# Patient Record
Sex: Male | Born: 1960 | Race: White | Hispanic: No | Marital: Single | Smoking: Never smoker
Health system: Southern US, Community
[De-identification: ages and names within clinical notes are randomized; demographics above are authoritative.]

---

## 2002-08-02 ENCOUNTER — Encounter: Admission: RE | Admit: 2002-08-02 | Discharge: 2002-10-31 | Payer: Self-pay | Admitting: Family Medicine

## 2009-08-30 ENCOUNTER — Emergency Department (HOSPITAL_COMMUNITY): Admission: EM | Admit: 2009-08-30 | Discharge: 2009-08-30 | Payer: Self-pay | Admitting: Emergency Medicine

## 2012-02-09 ENCOUNTER — Telehealth: Payer: Self-pay | Admitting: Internal Medicine

## 2012-02-09 NOTE — Telephone Encounter (Signed)
LVOM for pt to return call.  °

## 2015-11-26 ENCOUNTER — Ambulatory Visit (HOSPITAL_COMMUNITY)
Admission: EM | Admit: 2015-11-26 | Discharge: 2015-11-26 | Disposition: A | Discharge status: Home / Self Care | Payer: BLUE CROSS/BLUE SHIELD | Attending: Family Medicine | Admitting: Family Medicine

## 2015-11-26 ENCOUNTER — Encounter (HOSPITAL_COMMUNITY): Payer: Self-pay | Admitting: Emergency Medicine

## 2015-11-26 DIAGNOSIS — J40 Bronchitis, not specified as acute or chronic: Secondary | ICD-10-CM

## 2015-11-26 MED ORDER — AZITHROMYCIN 250 MG PO TABS: 250.0000 mg | ORAL_TABLET | Freq: Every day | ORAL | 0 refills | Status: DC

## 2015-11-26 MED ORDER — HYDROCODONE-HOMATROPINE 5-1.5 MG/5ML PO SYRP: 5.0000 mL | ORAL_SOLUTION | Freq: Four times a day (QID) | ORAL | 0 refills | Status: DC | PRN

## 2015-11-26 NOTE — ED Triage Notes (Signed)
The patient presented to the [facility] with a complaint of a cough. The patient reported that he has had a productive cough x 10 days. The patient reported that the symptoms get worse at night and when laying down.

## 2015-11-26 NOTE — ED Provider Notes (Addendum)
[facility]    CSN: [id number] Arrival date & time: 11/26/15  1020     History   Chief Complaint Chief Complaint  Patient presents with  . Cough    HPI Marjory SneddonGregory Nyborg is a 55 y.o. male.   This a 55 year old man has had a cough for over 10 days. He says it's been productive. He does not smoke and has no history of asthma. He has not had a fever.  Patient is not sleeping well because when he lies down the cough gets much worse.  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      History reviewed. No pertinent past medical history.  There are no active problems to display for this patient.   History reviewed. No pertinent surgical history.     Home Medications    Prior to Admission medications   Medication Sig Start Date End Date Taking? Authorizing Provider  azithromycin (ZITHROMAX) 250 MG tablet Take 1 tablet (250 mg total) by mouth daily. Take first 2 tablets together, then 1 every day until finished. 11/26/15   Elvina SidleKurt Chan Rosasco, MD  HYDROcodone-homatropine [facility](HYCODAN) 5-1.5 MG/5ML syrup Take 5 mLs by mouth every 6 (six) hours as needed for cough. 11/26/15   Elvina SidleKurt Kenyatta Gloeckner, MD    Family History History reviewed. No pertinent family history.  Social History Social History  Substance Use Topics  . Smoking status: Never Smoker  . Smokeless tobacco: Never Used  . Alcohol use No     Allergies   Patient has no known allergies.   Review of Systems Review of Systems  Constitutional: Negative.   HENT: Negative.   Respiratory: Positive for cough.   Cardiovascular: Negative.   Gastrointestinal: Negative.   Musculoskeletal: Negative.   Neurological: Negative.      Physical Exam Triage Vital Signs ED Triage Vitals  Enc Vitals Group     BP 11/26/15 1137 125/73     Pulse Rate 11/26/15 1137 68     Resp 11/26/15 1137 16     Temp 11/26/15 1137 98.6 F (37 C)      Temp Source 11/26/15 1137 Oral     SpO2 11/26/15 1137 99 %     Weight --      Height --      Head Circumference --      Peak Flow --      Pain Score 11/26/15 1141 0     Pain Loc --      Pain Edu? --      Excl. in GC? --    No data found.   Updated Vital Signs BP 125/73 (BP Location: Left Arm)   Pulse 68   Temp 98.6 F (37 C) (Oral)   Resp 16   SpO2 99%    Physical Exam  Constitutional: He is oriented to person, place, and time. He appears well-developed and well-nourished.  HENT:  Head: Normocephalic.  Right Ear: External ear normal.  Left Ear: External ear normal.  Mouth/Throat: Oropharynx is clear and moist.  Eyes: Conjunctivae and EOM are normal.  Neck: Normal range of motion. Neck supple.  Cardiovascular: Normal rate, regular rhythm and normal heart sounds.   Pulmonary/Chest: Effort normal.  Musculoskeletal: Normal range of motion.  Neurological: He is alert and oriented to person, place, and time.  Skin: Skin is warm and dry.  Nursing note and vitals reviewed.    UC  Treatments / Results  Labs (all labs ordered are listed, but only abnormal results are displayed) Labs Reviewed - No data to display  EKG  EKG Interpretation None       Radiology No results found.  Procedures Procedures (including critical care time)  Medications Ordered in UC Medications - No data to display   Initial Impression / Assessment and Plan / UC Course  I have reviewed the triage vital signs and the nursing notes.  Pertinent labs & imaging results that were available during my care of the patient were reviewed by me and considered in my medical decision making (see chart for details).  Clinical Course     Final Clinical Impressions(s) / UC Diagnoses   Final diagnoses:  Bronchitis    New Prescriptions New Prescriptions   AZITHROMYCIN (ZITHROMAX) 250 MG TABLET    Take 1 tablet (250 mg total) by mouth daily. Take first 2 tablets together, then 1 every day  until finished.   HYDROCODONE-HOMATROPINE (HYCODAN) 5-1.5 MG/5ML SYRUP    Take 5 mLs by mouth every 6 (six) hours as needed for cough.     Elvina SidleKurt Elsie Baynes, MD 11/26/15 1200    Elvina SidleKurt Ioma Chismar, MD 11/26/15 1200

## 2020-02-22 ENCOUNTER — Other Ambulatory Visit: Payer: Self-pay

## 2020-02-22 ENCOUNTER — Emergency Department
Admission: EM | Admit: 2020-02-22 | Discharge: 2020-02-22 | Disposition: A | Discharge status: Home / Self Care | Payer: BC Managed Care – PPO | Attending: Emergency Medicine | Admitting: Emergency Medicine

## 2020-02-22 ENCOUNTER — Emergency Department: Payer: BC Managed Care – PPO

## 2020-02-22 DIAGNOSIS — R1084 Generalized abdominal pain: Secondary | ICD-10-CM | POA: Diagnosis not present

## 2020-02-22 DIAGNOSIS — R110 Nausea: Secondary | ICD-10-CM | POA: Insufficient documentation

## 2020-02-22 DIAGNOSIS — R109 Unspecified abdominal pain: Secondary | ICD-10-CM | POA: Diagnosis present

## 2020-02-22 LAB — URINALYSIS, COMPLETE (UACMP) WITH MICROSCOPIC
Bacteria, UA: NONE SEEN
Bilirubin Urine: NEGATIVE
Glucose, UA: NEGATIVE mg/dL
Hgb urine dipstick: NEGATIVE
Ketones, ur: 5 mg/dL — AB
Leukocytes,Ua: NEGATIVE
Nitrite: NEGATIVE
Protein, ur: NEGATIVE mg/dL
Specific Gravity, Urine: 1.032 — ABNORMAL HIGH (ref 1.005–1.030)
Squamous Epithelial / LPF: NONE SEEN (ref 0–5)
pH: 7 (ref 5.0–8.0)

## 2020-02-22 LAB — CBC
HCT: 38.9 % — ABNORMAL LOW (ref 39.0–52.0)
Hemoglobin: 13.8 g/dL (ref 13.0–17.0)
MCH: 32.5 pg (ref 26.0–34.0)
MCHC: 35.5 g/dL (ref 30.0–36.0)
MCV: 91.5 fL (ref 80.0–100.0)
Platelets: 163 10*3/uL (ref 150–400)
RBC: 4.25 MIL/uL (ref 4.22–5.81)
RDW: 12.3 % (ref 11.5–15.5)
WBC: 5.2 10*3/uL (ref 4.0–10.5)
nRBC: 0 % (ref 0.0–0.2)

## 2020-02-22 LAB — COMPREHENSIVE METABOLIC PANEL
ALT: 28 U/L (ref 0–44)
AST: 34 U/L (ref 15–41)
Albumin: 4.3 g/dL (ref 3.5–5.0)
Alkaline Phosphatase: 62 U/L (ref 38–126)
Anion gap: 10 (ref 5–15)
BUN: 42 mg/dL — ABNORMAL HIGH (ref 6–20)
CO2: 24 mmol/L (ref 22–32)
Calcium: 9.2 mg/dL (ref 8.9–10.3)
Chloride: 103 mmol/L (ref 98–111)
Creatinine, Ser: 1.06 mg/dL (ref 0.61–1.24)
GFR, Estimated: >60 mL/min (ref >60)
Glucose, Bld: 148 mg/dL — ABNORMAL HIGH (ref 70–99)
Potassium: 3.6 mmol/L (ref 3.5–5.1)
Sodium: 137 mmol/L (ref 135–145)
Total Bilirubin: 0.7 mg/dL (ref 0.3–1.2)
Total Protein: 6.4 g/dL — ABNORMAL LOW (ref 6.5–8.1)

## 2020-02-22 LAB — LIPASE, BLOOD: Lipase: 50 U/L (ref 11–51)

## 2020-02-22 MED ORDER — ONDANSETRON 4 MG PO TBDP: 4.0000 mg | ORAL_TABLET | Freq: Four times a day (QID) | ORAL | 0 refills | Status: DC | PRN

## 2020-02-22 MED ORDER — DICYCLOMINE HCL 10 MG/ML IM SOLN
20.0000 mg | Freq: Once | INTRAMUSCULAR | Status: AC
  Administered 2020-02-22: 20 mg via INTRAMUSCULAR
  Filled 2020-02-22 (×2): qty 2

## 2020-02-22 MED ORDER — DICYCLOMINE HCL 20 MG PO TABS: 20.0000 mg | ORAL_TABLET | Freq: Three times a day (TID) | ORAL | 0 refills | Status: DC | PRN

## 2020-02-22 MED ORDER — ONDANSETRON HCL 4 MG/2ML IJ SOLN
4.0000 mg | Freq: Once | INTRAMUSCULAR | Status: AC
  Administered 2020-02-22: 4 mg via INTRAVENOUS
  Filled 2020-02-22: qty 2

## 2020-02-22 MED ORDER — SODIUM CHLORIDE 0.9 % IV BOLUS (SEPSIS)
1000.0000 mL | Freq: Once | INTRAVENOUS | Status: AC
  Administered 2020-02-22: 1000 mL via INTRAVENOUS

## 2020-02-22 MED ORDER — IOHEXOL 12 MG/ML PO SOLN
500.0000 mL | ORAL | Status: AC
  Administered 2020-02-22: 500 mL via ORAL

## 2020-02-22 MED ORDER — KETOROLAC TROMETHAMINE 30 MG/ML IJ SOLN
30.0000 mg | Freq: Once | INTRAMUSCULAR | Status: AC
  Administered 2020-02-22: 30 mg via INTRAVENOUS
  Filled 2020-02-22: qty 1

## 2020-02-22 MED ORDER — IOHEXOL 300 MG/ML  SOLN
100.0000 mL | Freq: Once | INTRAMUSCULAR | Status: AC | PRN
  Administered 2020-02-22: 100 mL via INTRAVENOUS

## 2020-02-22 NOTE — ED Notes (Signed)
CT tech at bedside to take pt for CT scan.

## 2020-02-22 NOTE — ED Provider Notes (Signed)
[facility] Emergency Department Provider Note  ____________________________________________   Event Date/Time   First MD Initiated Contact with Patient 02/22/20 0121     (approximate)  I have reviewed the triage vital signs and the nursing notes.   HISTORY  Chief Complaint Abdominal Pain    HPI Robert Huynh is a 60 y.o. male no significant past medical history who presents to the emergency department with diffuse, sharp and severe abdominal pain that started last night about 2 hours after eating dinner.  He has had nausea without vomiting.  States he was able to have a normal bowel movement without blood or melena.  No diarrhea.  States he felt like he had a fever at home but is afebrile here.  States pain came on suddenly.  He has never had similar symptoms.  No aggravating or alleviating factors.  No history of previous abdominal surgery.  Patient reports he normally is bradycardic and slightly hypotensive.  He denies any chest pain or shortness of breath.  States he has been very active for most of his life and was a runner which he attributes to his heart rate and blood pressure normally low.        History reviewed. No pertinent past medical history.  There are no problems to display for this patient.   No past surgical history on file.  Prior to Admission medications   Medication Sig Start Date End Date Taking? Authorizing Provider  dicyclomine (BENTYL) 20 MG tablet Take 1 tablet (20 mg total) by mouth every 8 (eight) hours as needed for spasms (Abdominal cramping). 02/22/20  Yes Alexiya Franqui, Baxter HireKristen N, DO  ondansetron (ZOFRAN ODT) 4 MG disintegrating tablet Take 1 tablet (4 mg total) by mouth every 6 (six) hours as needed for nausea or vomiting. 02/22/20  Yes Alexsus Papadopoulos, Layla MawKristen N, DO    Allergies Patient has no known allergies.  No family history on file.  Social History Social History   Tobacco Use  . Smoking status: Never Smoker  . Smokeless  tobacco: Never Used  Substance Use Topics  . Alcohol use: No    Review of Systems Constitutional: No fever. Eyes: No visual changes. ENT: No sore throat. Cardiovascular: Denies chest pain. Respiratory: Denies shortness of breath. Gastrointestinal: + nausea.  No vomiting, diarrhea. Genitourinary: Negative for dysuria. Musculoskeletal: Negative for back pain. Skin: Negative for rash. Neurological: Negative for focal weakness or numbness.  ____________________________________________   PHYSICAL EXAM:  VITAL SIGNS: ED Triage Vitals  Enc Vitals Group     BP 02/22/20 0022 (!) 92/49     Pulse Rate 02/22/20 0022 (!) 50     Resp 02/22/20 0022 18     Temp 02/22/20 0022 97.9 F (36.6 C)     Temp Source 02/22/20 0022 Oral     SpO2 02/22/20 0022 100 %     Weight 02/22/20 0023 160 lb (72.6 kg)     Height 02/22/20 0023 6\' 1"  (1.854 m)     Head Circumference --      Peak Flow --      Pain Score 02/22/20 0023 9     Pain Loc --      Pain Edu? --      Excl. in GC? --    CONSTITUTIONAL: Alert and oriented and responds appropriately to questions. Well-appearing; well-nourished HEAD: Normocephalic EYES: Conjunctivae clear, pupils appear equal, EOM appear intact ENT: normal nose; moist mucous membranes NECK: Supple, normal ROM CARD: Regular and bradycardic; S1 and S2 appreciated;  no murmurs, no clicks, no rubs, no gallops RESP: Normal chest excursion without splinting or tachypnea; breath sounds clear and equal bilaterally; no wheezes, no rhonchi, no rales, no hypoxia or respiratory distress, speaking full sentences ABD/GI: Normal bowel sounds; non-distended; soft, diffusely tender throughout the abdomen without guarding or rebound BACK: The back appears normal EXT: Normal ROM in all joints; no deformity noted, no edema; no cyanosis SKIN: Normal color for age and race; warm; no rash on exposed skin NEURO: Moves all extremities equally PSYCH: The patient's mood and manner are  appropriate.  ____________________________________________   LABS (all labs ordered are listed, but only abnormal results are displayed)  Labs Reviewed  COMPREHENSIVE METABOLIC PANEL - Abnormal; Notable for the following components:      Result Value   Glucose, Bld 148 (*)    BUN 42 (*)    Total Protein 6.4 (*)    All other components within normal limits  CBC - Abnormal; Notable for the following components:   HCT 38.9 (*)    All other components within normal limits  URINALYSIS, COMPLETE (UACMP) WITH MICROSCOPIC - Abnormal; Notable for the following components:   Color, Urine YELLOW (*)    APPearance CLOUDY (*)    Specific Gravity, Urine 1.032 (*)    Ketones, ur 5 (*)    All other components within normal limits  LIPASE, BLOOD   ____________________________________________  EKG   EKG Interpretation  Date/Time:  Thursday February 22 2020 00:26:18 EST Ventricular Rate:  48 PR Interval:  172 QRS Duration: 108 QT Interval:  452 QTC Calculation: 403 R Axis:   91 Text Interpretation: Sinus bradycardia Rightward axis Incomplete right bundle branch block Borderline ECG No old tracing to compare Confirmed by Rochele Raring [phone]) on 02/22/2020 2:06:41 AM       ____________________________________________  RADIOLOGY Normajean Baxter Cordelle Dahmen, personally viewed and evaluated these images (plain radiographs) as part of my medical decision making, as well as reviewing the written report by the radiologist.  ED MD interpretation: No bowel obstruction.  No perforation.  No ischemia.  Official radiology report(s): CT ABDOMEN PELVIS W CONTRAST  Result Date: 02/22/2020 CLINICAL DATA:  Periumbilical and right lower quadrant abdominal pain after meal. EXAM: CT ABDOMEN AND PELVIS WITH CONTRAST TECHNIQUE: Multidetector CT imaging of the abdomen and pelvis was performed using the standard protocol following bolus administration of intravenous contrast. CONTRAST:  OMNIPAQUE IOHEXOL 300  MG/ML  SOLN COMPARISON:  None. FINDINGS: Lower chest: No acute abnormality. Hepatobiliary: Periportal edema. Subcentimeter hypodensities too small to characterize within left hepatic lobe. No gallstones, gallbladder wall thickening, or pericholecystic fluid. No biliary dilatation. Pancreas: No focal lesion. Normal pancreatic contour. No surrounding inflammatory changes. No main pancreatic ductal dilatation. Spleen: Normal in size without focal abnormality. Adrenals/Urinary Tract: No adrenal nodule bilaterally. Bilateral kidneys enhance symmetrically. No hydronephrosis. No hydroureter. The urinary bladder is unremarkable. Stomach/Bowel: Stomach is within normal limits. No small large bowel wall thickening. No small or large bowel dilatation. Stool noted throughout the colon. Fecalized sign noted within many loops of the small bowel. No pneumatosis. Vascular/Lymphatic: No portal venous gas. No abdominal aorta or iliac aneurysm. Mild atherosclerotic plaque of the aorta and its branches. No abdominal, pelvic, or inguinal lymphadenopathy. Reproductive: The prostate is prominent in size. Other: Fluid noted throughout the mesentery. Swirling of the superior vessel mesentery. No intraperitoneal free gas. No organized fluid collection. Musculoskeletal: No acute or significant osseous findings. IMPRESSION: 1. Fecalized material throughout many loops of small bowel with associated stool  throughout the colon. Periportal edema as well as mesenteric fluid. 2. Swirling of the superior vessel mesentery with associated above findings could suggest a possible internal hernia; however, no definite transition point or bowel dilatation to suggest bowel obstruction. No bowel wall thickening, pneumatosis, or portal venous gas to suggest ischemia. No findings of bowel perforation. 3. Given appearance of the small bowel, consider clinical correlation for SIBO. Electronically Signed   By: Tish Frederickson M.D.   On: 02/22/2020 03:56     ____________________________________________   PROCEDURES  Procedure(s) performed (including Critical Care):  Procedures   ____________________________________________   INITIAL IMPRESSION / ASSESSMENT AND PLAN / ED COURSE  As part of my medical decision making, I reviewed the following data within the electronic MEDICAL RECORD NUMBER Nursing notes reviewed and incorporated, Labs reviewed, Ct reviewed , Old chart reviewed, A consult was requested and obtained from this/these consultant(s) Gastroenterology and Notes from prior ED visits         Patient here with abdominal pain after eating.  Differential includes gastritis, gastroenteritis, colitis, diverticulitis, UTI, kidney stone, cholecystitis, pancreatitis, appendicitis.  Labs obtained in triage are unremarkable.  Will obtain CT of the abdomen pelvis for further evaluation.  Reports he drove himself to the emergency department.  Will give IV fluids, Toradol and Zofran for symptomatic relief.  ED PROGRESS  2:22 AM  Pt reports no improvement in pain after Toradol.  He does not have anyone to drive him home at this time if his work-up is unremarkable.  Will give IM Bentyl for pain control.  4:27 AM Pt reports pain gone after Bentyl.  CT scan shows fecalized material throughout many loops of small bowel with periportal edema as well as mesenteric fluid.  He has swirling of the superior vessel mesentery that could suggest an internal hernia but no definite transition point, bowel dilation to suggest bowel obstruction.  No signs of bowel ischemia or perforation.  Radiologist concern for possible small intestine bacterial overgrowth.  He denies history of abdominal pain, bloating, gas.  Will discuss with GI on-call for recommendations.  4:29 AM  Spoke with Dr. Norma Fredrickson on-call for GI.  He agrees that it would be very difficult to diagnose small intestine bacterial overgrowth by CT imaging and recommends conservative management with  outpatient GI follow-up.  Patient has had a normal colonoscopy in June 2021 other than having one polyp.  His gastroenterologist is Dr. Elnoria Howard in [place].  Patient feels comfortable with plan to follow-up with Dr. Elnoria Howard.  Recommended bland diet for the next several days.  Will discharge with Bentyl, Zofran.  Discussed return precautions.  He is comfortable with this plan.  Urine shows no sign of infection but does show yeast in his urine.  He is asymptomatic and not undergoing urologic procedure or immunocompromise.  Therapy not indicated at this time. ____________________________________________   FINAL CLINICAL IMPRESSION(S) / ED DIAGNOSES  Final diagnoses:  Generalized abdominal pain     ED Discharge Orders         Ordered    dicyclomine (BENTYL) 20 MG tablet  Every 8 hours PRN        02/22/20 0512    ondansetron (ZOFRAN ODT) 4 MG disintegrating tablet  Every 6 hours PRN        02/22/20 2094          *Please note:  Robert Huynh was evaluated in Emergency Department on 02/22/2020 for the symptoms described in the history of present illness. He was evaluated in the  context of the global COVID-19 pandemic, which necessitated consideration that the patient might be at risk for infection with the SARS-CoV-2 virus that causes COVID-19. Institutional protocols and algorithms that pertain to the evaluation of patients at risk for COVID-19 are in a state of rapid change based on information released by regulatory bodies including the CDC and federal and state organizations. These policies and algorithms were followed during the patient's care in the ED.  Some ED evaluations and interventions may be delayed as a result of limited staffing during and the pandemic.*   Note:  This document was prepared using Dragon voice recognition software and may include unintentional dictation errors.   Nicholas Ossa, Layla Maw, DO 02/22/20 [phone]

## 2020-02-22 NOTE — ED Notes (Signed)
Pt provided with urinal, reports will attempt to provide UA.

## 2020-02-22 NOTE — ED Notes (Signed)
Pt states coming in for abdominal pain that started several hours after eating dinner. Pt states some nausea, but denies vomiting and diarrhea. Pt states prior to dinner last night he had a "large bowel movement."  Pt on cardiac, bp and pulse ox monitor on pt

## 2020-02-22 NOTE — Discharge Instructions (Signed)
Your labs, urine today were reassuring.  Your CT scan showed signs that the radiologist was concerned could be from small intestinal bacterial overgrowth.  We discussed this with the gastroenterologist on-call who recommends conservative management and close follow-up with your GI physician as an outpatient.

## 2020-02-22 NOTE — ED Triage Notes (Addendum)
Pt to ed c/o abd pain that started 2-3 hours after eating chicken stir fry, and sesame and pumpkin seeds. Pt states pain is 9/10 stabbing pain. Pt has no GI hx. Pt unable to sit still during triage, pale in color.

## 2020-03-27 ENCOUNTER — Telehealth: Payer: BC Managed Care – PPO | Admitting: Family

## 2020-03-27 ENCOUNTER — Encounter: Payer: Self-pay | Admitting: Family

## 2020-03-27 DIAGNOSIS — Z09 Encounter for follow-up examination after completed treatment for conditions other than malignant neoplasm: Secondary | ICD-10-CM

## 2020-03-27 DIAGNOSIS — R109 Unspecified abdominal pain: Secondary | ICD-10-CM

## 2020-03-27 MED ORDER — DICYCLOMINE HCL 20 MG PO TABS: 20.0000 mg | ORAL_TABLET | Freq: Three times a day (TID) | ORAL | 0 refills | Status: AC | PRN

## 2020-03-27 MED ORDER — DICYCLOMINE HCL 20 MG PO TABS: 20.0000 mg | ORAL_TABLET | Freq: Three times a day (TID) | ORAL | 0 refills | Status: DC | PRN

## 2020-03-27 NOTE — Progress Notes (Signed)
Virtual Visit  Note Due to COVID-19 pandemic this visit was conducted virtually. This visit type was conducted due to national recommendations for restrictions regarding the COVID-19 Pandemic (e.g. social distancing, sheltering in place) in an effort to limit this patient's exposure and mitigate transmission in our community. All issues noted in this document were discussed and addressed.  A physical exam was not performed with this format.  I connected with Robert Huynh on 03/27/20 at 11:11 AM by video and verified that I am speaking with the correct person using two identifiers. [name] is currently located at home and no one  is currently with him during visit. The provider, Jannifer Rodney, FNP is located in their office at time of visit.  I discussed the limitations, risks, security and privacy concerns of performing an evaluation and management service by video and the availability of in person appointments. I also discussed with the patient that there may be a patient responsible charge related to this service. The patient expressed understanding and agreed to proceed.   History and Present Illness:   Pt presents to the office today with recurrent abdominal pain. He was seen in the ED on 02/22/20 and had a CT that showed "1. Fecalized material throughout many loops of small bowel with associated stool throughout the colon. Periportal edema as well as mesenteric fluid. 2. Swirling of the superior vessel mesentery with associated above findings could suggest a possible internal hernia; however, no definite transition point or bowel dilatation to suggest bowel obstruction. No bowel wall thickening, pneumatosis, or portal venous gas to suggest ischemia. No findings of bowel perforation. 3. Given appearance of the small bowel, consider clinical correlation for SIBO." He was discharged with a rx of zofran and bentyl. He reports he did not get either of these filled. He followed up with  his GI. He had a colonoscopy in June.  Abdominal Pain This is a recurrent problem. The current episode started more than 1 month ago. The problem occurs constantly. The problem has been gradually worsening. The pain is located in the generalized abdominal region, RLQ and LLQ. The pain is at a severity of 2/10 (constantly 2, but can be ). The quality of the pain is dull. The abdominal pain radiates to the back. Associated symptoms include belching and flatus. Pertinent negatives include no constipation, diarrhea, dysuria, fever, nausea or vomiting.     Review of Systems  Constitutional: Negative for fever.  Gastrointestinal: Positive for abdominal pain and flatus. Negative for constipation, diarrhea, nausea and vomiting.  Genitourinary: Negative for dysuria.     Observations/Objective: No SOB or distress noted   Assessment and Plan: 1. Abdominal pain, unspecified abdominal location - dicyclomine (BENTYL) 20 MG tablet; Take 1 tablet (20 mg total) by mouth every 8 (eight) hours as needed for spasms (Abdominal cramping).  Dispense: 40 tablet; Refill: 0  2. Hospital discharge follow-up - dicyclomine (BENTYL) 20 MG tablet; Take 1 tablet (20 mg total) by mouth every 8 (eight) hours as needed for spasms (Abdominal cramping).  Dispense: 40 tablet; Refill: 0   Encourage bland diet Force fluids Try Bentyl as needed for pain Follow up with GI    I discussed the assessment and treatment plan with the patient. The patient was provided an opportunity to ask questions and all were answered. The patient agreed with the plan and demonstrated an understanding of the instructions.   The patient was advised to call back or seek an in-person evaluation if the symptoms worsen or  if the condition fails to improve as anticipated.  The above assessment and management plan was discussed with the patient. The patient verbalized understanding of and has agreed to the management plan. Patient is aware to call  the clinic if symptoms persist or worsen. Patient is aware when to return to the clinic for a follow-up visit. Patient educated on when it is appropriate to go to the emergency department.   Time call ended: 11:41 AM   I provided 30 minutes of  face-to-face time during this encounter.    Jannifer Rodney, FNP

## 2022-04-06 IMAGING — CT CT ABD-PELV W/ CM
2 of 5 series · 15 of 46 positions shown, 17 images · IV contrast (APPLIED)
Comparison: None.

CLINICAL DATA: Periumbilical and right lower quadrant abdominal
pain after meal.

EXAM:
CT ABDOMEN AND PELVIS WITH CONTRAST
TECHNIQUE: Multidetector CT imaging of the abdomen and pelvis was performed
using the standard protocol following bolus administration of
intravenous contrast.
CONTRAST:  100mL OMNIPAQUE IOHEXOL 300 MG/ML  SOLN

[Series 2: routine abd/pel with · axial · 0.76mm/px · z∈[-450,-55]mm · 12 of 89 slices shown, 14 images]
[im 5/89  soft-tissue]
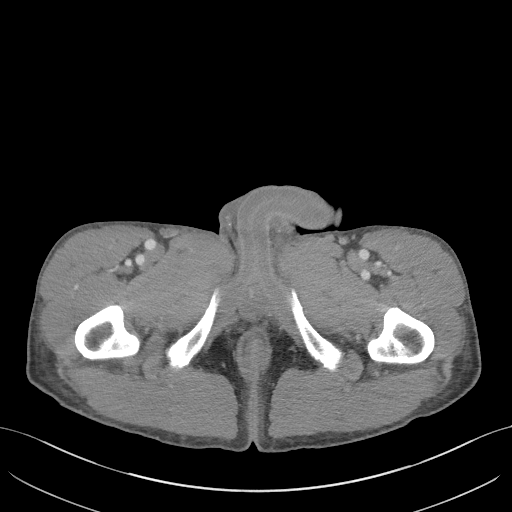
[im 5/89  bone]
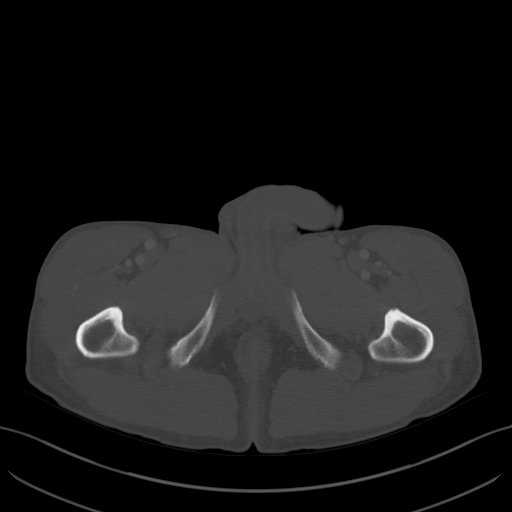
[im 15/89  soft-tissue]
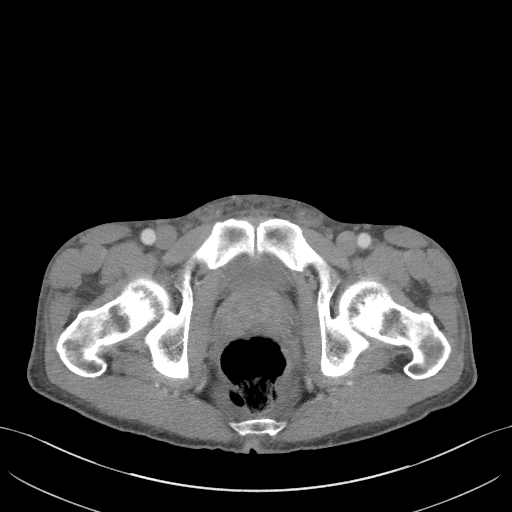
[im 20/89  soft-tissue]
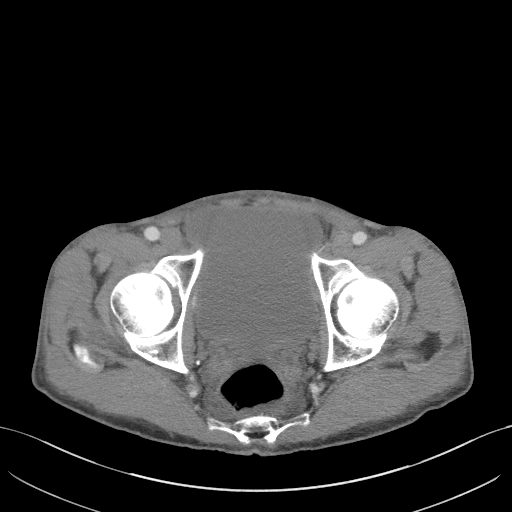
[im 25/89  soft-tissue]
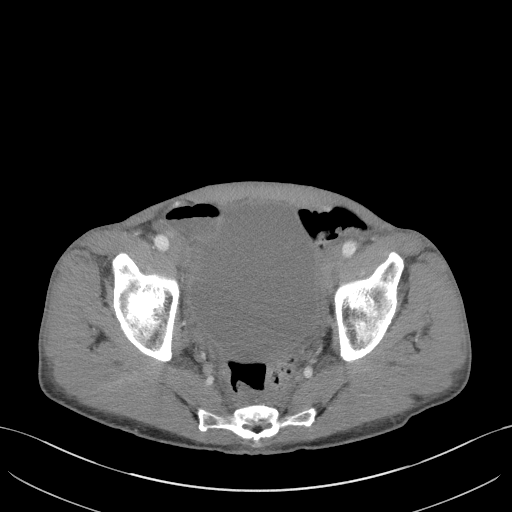
[im 35/89  soft-tissue]
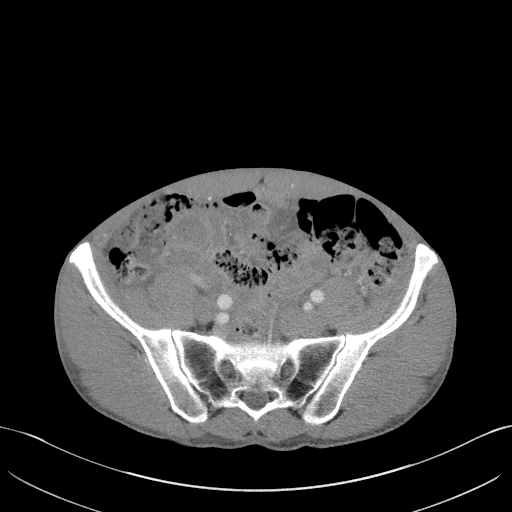
[im 40/89  soft-tissue]
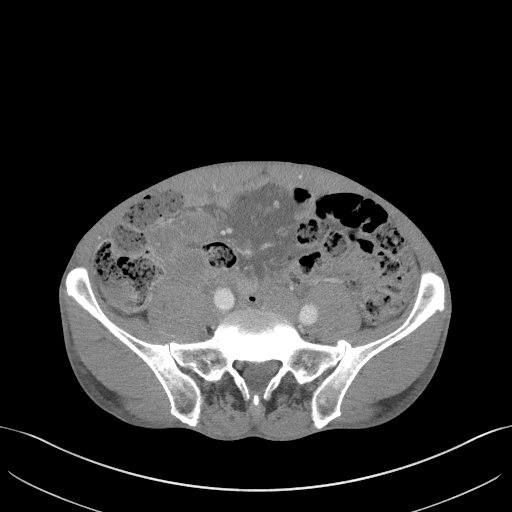
[im 49/89  soft-tissue]
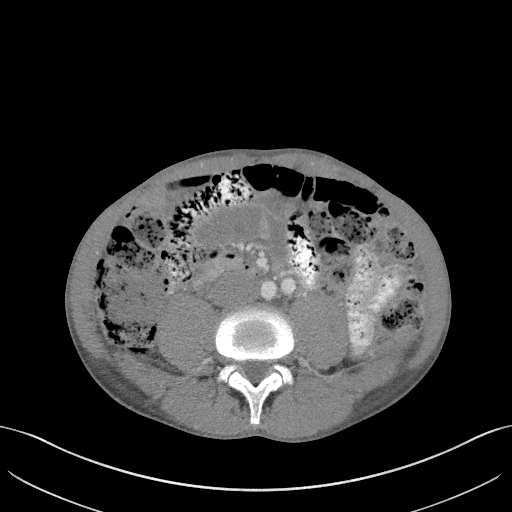
[im 54/89  soft-tissue]
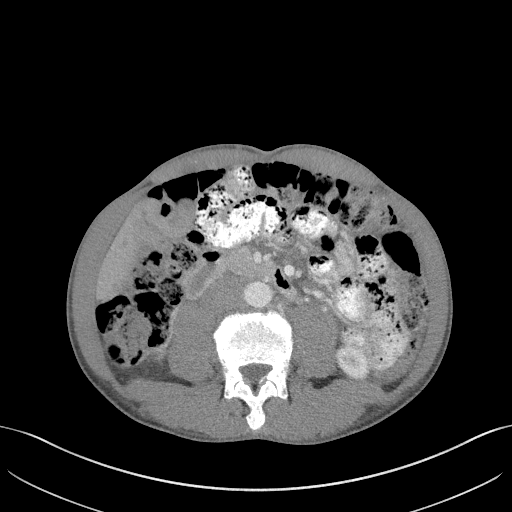
[im 64/89  soft-tissue]
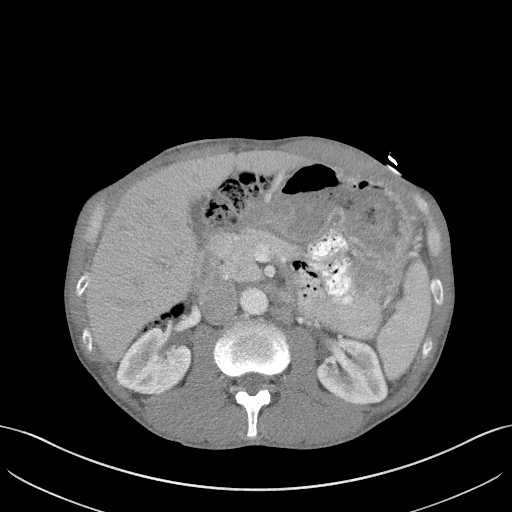
[im 64/89  bone]
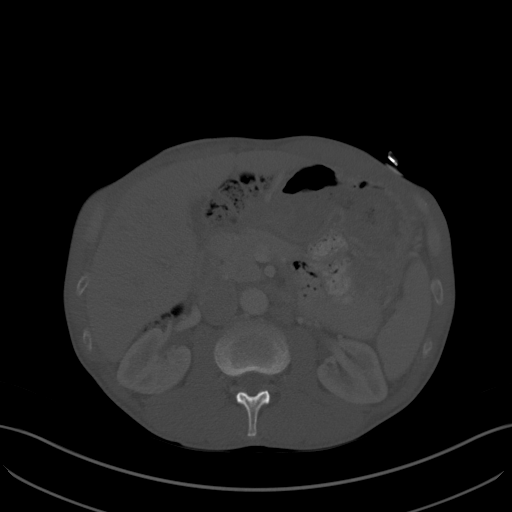
[im 69/89  soft-tissue]
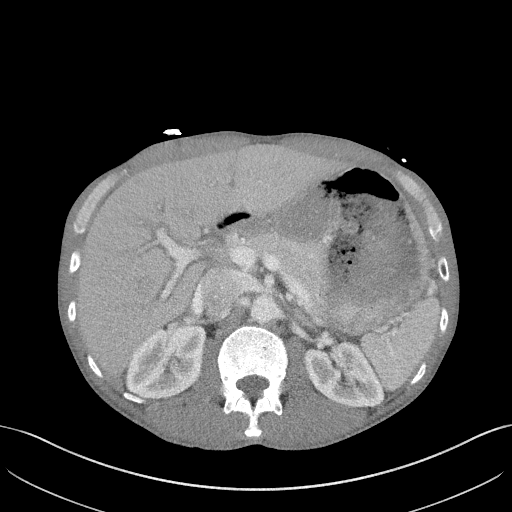
[im 74/89  soft-tissue]
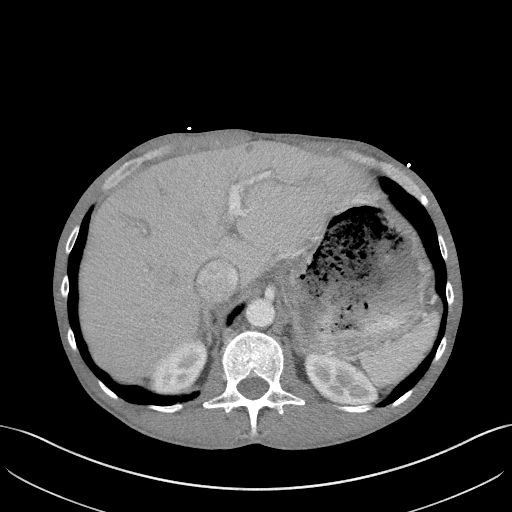
[im 84/89  soft-tissue]
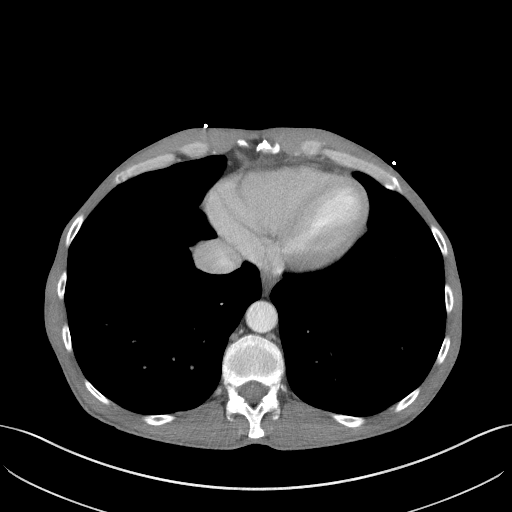

[Series 5: coronal st · coronal · 0.69mm/px · 3 of 86 slices shown]
[im 29/86  soft-tissue]
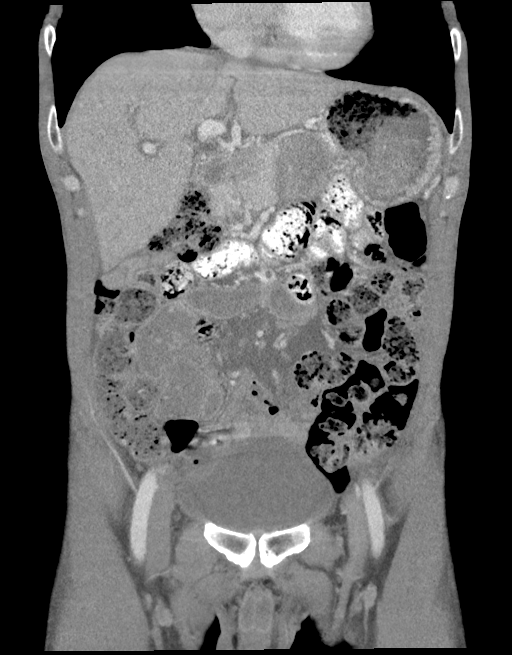
[im 38/86  soft-tissue]
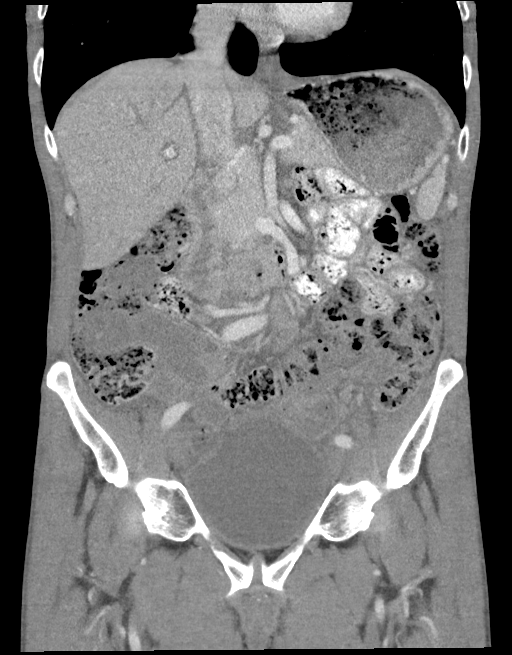
[im 48/86  soft-tissue]
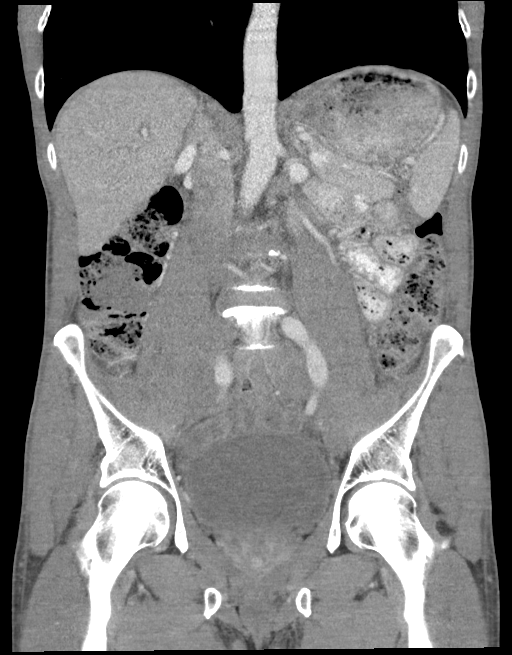

[15 of 46 positions shown; findings below may reference images not displayed]

FINDINGS: Lower chest: No acute abnormality.

Hepatobiliary: Periportal edema. Subcentimeter hypodensities too
small to characterize within left hepatic lobe. No gallstones,
gallbladder wall thickening, or pericholecystic fluid. No biliary
dilatation.

Pancreas: No focal lesion. Normal pancreatic contour. No surrounding
inflammatory changes. No main pancreatic ductal dilatation.

Spleen: Normal in size without focal abnormality.

Adrenals/Urinary Tract: No adrenal nodule bilaterally. Bilateral
kidneys enhance symmetrically. No hydronephrosis. No hydroureter.
The urinary bladder is unremarkable.

Stomach/Bowel: Stomach is within normal limits. No small large bowel
wall thickening. No small or large bowel dilatation. Stool noted
throughout the colon. Fecalized sign noted within many loops of the
small bowel. No pneumatosis.

Vascular/Lymphatic: No portal venous gas. No abdominal aorta or
iliac aneurysm. Mild atherosclerotic plaque of the aorta and its
branches. No abdominal, pelvic, or inguinal lymphadenopathy.

Reproductive: The prostate is prominent in size.

Other: Fluid noted throughout the mesentery. Swirling of the
superior vessel mesentery. No intraperitoneal free gas. No organized
fluid collection.

Musculoskeletal: No acute or significant osseous findings.
IMPRESSION: 1. Fecalized material throughout many loops of small bowel with
associated stool throughout the colon. Periportal edema as well as
mesenteric fluid.
2. Swirling of the superior vessel mesentery with associated above
findings could suggest a possible internal hernia; however, no
definite transition point or bowel dilatation to suggest bowel
obstruction. No bowel wall thickening, pneumatosis, or portal venous
gas to suggest ischemia. No findings of bowel perforation.
3. Given appearance of the small bowel, consider clinical
correlation for SIBO.

## 2023-04-06 ENCOUNTER — Ambulatory Visit: Admitting: Orthopedic Surgery

## 2023-04-07 ENCOUNTER — Ambulatory Visit: Admitting: Orthopedic Surgery

## 2023-11-08 ENCOUNTER — Encounter: Payer: Self-pay | Admitting: Radiology
